# Patient Record
Sex: Male | Born: 1970 | Race: White | Hispanic: No | Marital: Married | Smoking: Never smoker
Health system: Southern US, Community
[De-identification: ages and names within clinical notes are randomized; demographics above are authoritative.]

## PROBLEM LIST (undated history)

## (undated) DIAGNOSIS — K219 Gastro-esophageal reflux disease without esophagitis: Secondary | ICD-10-CM

## (undated) DIAGNOSIS — G8929 Other chronic pain: Secondary | ICD-10-CM

## (undated) DIAGNOSIS — J40 Bronchitis, not specified as acute or chronic: Secondary | ICD-10-CM

## (undated) DIAGNOSIS — M549 Dorsalgia, unspecified: Secondary | ICD-10-CM

## (undated) HISTORY — PX: BACK SURGERY: SHX140

## (undated) HISTORY — PX: DENTAL RESTORATION/EXTRACTION WITH X-RAY: SHX5796

---

## 2012-05-19 ENCOUNTER — Encounter (HOSPITAL_COMMUNITY): Payer: Self-pay | Admitting: Pharmacy Technician

## 2012-05-23 NOTE — Pre-Procedure Instructions (Addendum)
Roy Smith  05/23/2012   Your procedure is scheduled on:   Monday June 01, 2012  Report to [facility] at 530 AM.  Call this number if you have problems the morning of surgery: [phone]   Remember:   Do not eat food or drink liquids after midnight.   Take these medicines the morning of surgery with A SIP OF WATER: albuterol (can bring day of surgery).  ( STOP ibuprofen now)   Do not wear jewelry, make-up or nail polish.  Do not wear lotions, powders, or perfumes.  Do not shave 48 hours prior to surgery. Men may shave face and neck.  Do not bring valuables to the hospital.  Contacts, dentures or bridgework may not be worn into surgery.  Leave suitcase in the car. After surgery it may be brought to your room.     Patients discharged the day of surgery will not be allowed to drive home.  Name and phone number of your driver: family / friend   Special Instructions: Incentive Spirometry - Practice and bring it with you on the day of surgery. Shower using CHG 2 nights before surgery and the night before surgery.  If you shower the day of surgery use CHG.  Use special wash - you have one bottle of CHG for all showers.  You should use approximately 1/3 of the bottle for each shower.   Please read over the following fact sheets that you were given: Pain Booklet, Coughing and Deep Breathing, Blood Transfusion Information, MRSA Information and Surgical Site Infection Prevention

## 2012-05-25 ENCOUNTER — Encounter (HOSPITAL_COMMUNITY)
Admission: RE | Admit: 2012-05-25 | Discharge: 2012-05-25 | Disposition: A | Discharge status: Home / Self Care | Payer: No Typology Code available for payment source | Source: Ambulatory Visit | Attending: Specialist | Admitting: Specialist

## 2012-05-25 ENCOUNTER — Encounter (HOSPITAL_COMMUNITY): Payer: Self-pay

## 2012-05-25 ENCOUNTER — Ambulatory Visit (HOSPITAL_COMMUNITY)
Admission: RE | Admit: 2012-05-25 | Discharge: 2012-05-25 | Disposition: A | Discharge status: Home / Self Care | Payer: No Typology Code available for payment source | Source: Ambulatory Visit | Attending: Physician Assistant | Admitting: Physician Assistant

## 2012-05-25 DIAGNOSIS — Z01812 Encounter for preprocedural laboratory examination: Secondary | ICD-10-CM | POA: Insufficient documentation

## 2012-05-25 DIAGNOSIS — Z01818 Encounter for other preprocedural examination: Secondary | ICD-10-CM | POA: Insufficient documentation

## 2012-05-25 DIAGNOSIS — Z01810 Encounter for preprocedural cardiovascular examination: Secondary | ICD-10-CM | POA: Insufficient documentation

## 2012-05-25 HISTORY — DX: Bronchitis, not specified as acute or chronic: J40

## 2012-05-25 HISTORY — DX: Dorsalgia, unspecified: M54.9

## 2012-05-25 HISTORY — DX: Other chronic pain: G89.29

## 2012-05-25 HISTORY — DX: Gastro-esophageal reflux disease without esophagitis: K21.9

## 2012-05-25 LAB — ABO/RH: ABO/RH(D): A POS

## 2012-05-25 LAB — TYPE AND SCREEN
ABO/RH(D): A POS
Antibody Screen: NEGATIVE

## 2012-05-25 LAB — BASIC METABOLIC PANEL
CO2: 27 mEq/L (ref 19–32)
Calcium: 10 mg/dL (ref 8.4–10.5)
Chloride: 102 mEq/L (ref 96–112)
Glucose, Bld: 98 mg/dL (ref 70–99)
Sodium: 139 mEq/L (ref 135–145)

## 2012-05-25 LAB — URINALYSIS, ROUTINE W REFLEX MICROSCOPIC
Bilirubin Urine: NEGATIVE
Glucose, UA: NEGATIVE mg/dL
Hgb urine dipstick: NEGATIVE
Ketones, ur: NEGATIVE mg/dL
Specific Gravity, Urine: 1.019 (ref 1.005–1.030)
pH: 6 (ref 5.0–8.0)

## 2012-05-25 LAB — CBC
HCT: 44.7 % (ref 39.0–52.0)
MCH: 32.1 pg (ref 26.0–34.0)
MCV: 91.4 fL (ref 78.0–100.0)
Platelets: 178 10*3/uL (ref 150–400)
RBC: 4.89 MIL/uL (ref 4.22–5.81)
WBC: 3.8 10*3/uL — ABNORMAL LOW (ref 4.0–10.5)

## 2012-05-25 LAB — SURGICAL PCR SCREEN: Staphylococcus aureus: POSITIVE — AB

## 2012-05-25 MED ORDER — CHLORHEXIDINE GLUCONATE 4 % EX LIQD: 60.0000 mL | Freq: Once | CUTANEOUS | Status: DC

## 2012-05-29 NOTE — H&P (Signed)
Roy Smith is an 42 y.o. male.   Chief Complaint: back pain  HPI: Pt is S/P decompression and diskectomy L5-S1 in 2004.  He has continued to have progressive low back pain with intermittent left leg pain since his previous surgery. His pain is worsened with repetitive bending,stooping, long episodes of riding in the car or sitting.  He rates the pain in his back as severe or 9 out of 10 on the pain scale.  His left leg pain is primarily lateral thigh and calf and worse with prolonged walking and standing.  He has tried NSAIDS, PT, home exercise program and activity modification.  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  MRI shows collapse of the disk space at L5-S1.  There is a disk protrusion at Left L5-S1 which is broad based and affects the left L5 nerve root.  Also cystic and Modic changes involving the superior endplate of S1.  As pt continues with pain and dysfunction despite conservative measures, surgical intervention is recommended for Left L5-S1 discectomy and fusion with transforaminal lumbar interbody fusion and posterolateral fusion with pedicle screw and rod instrumentation.  Pt wishes to proceed.  Past Medical History  Diagnosis Date  . Bronchitis     hx of  . GERD (gastroesophageal reflux disease)     hx of taking zantac  . Chronic back pain     hx of    Past Surgical History  Procedure Laterality Date  . Back surgery    . Dental restoration/extraction with x-ray      hx of    No family history on file. Social History:  reports that he has never smoked. He does not have any smokeless tobacco history on file. He reports that  drinks alcohol. He reports that he does not use illicit drugs.  Allergies: No Known Allergies  Medications Prior to Admission  Medication Sig Dispense Refill  . albuterol (PROVENTIL HFA;VENTOLIN HFA) 108 (90 BASE) MCG/ACT inhaler Inhale 2 puffs into the lungs every 6 (six) hours as needed. For  shortness of breath      . ibuprofen (ADVIL,MOTRIN) 200 MG tablet Take 400 mg by mouth every 6 (six) hours as needed. For pain        No results found for this or any previous visit (from the past 48 hour(s)). No results found.  Review of Systems  Constitutional: Negative.   HENT: Negative.   Eyes: Negative.   Respiratory: Negative.   Cardiovascular: Negative.   Gastrointestinal: Negative.   Genitourinary: Negative.   Musculoskeletal: Positive for back pain.  Skin: Negative.   Neurological: Negative.   Endo/Heme/Allergies: Negative.   Psychiatric/Behavioral: Negative.     Blood pressure 132/94, pulse 67, temperature 98.1 F (36.7 C), temperature source Oral, resp. rate 20, SpO2 99.00%. Physical Exam  Constitutional: He is oriented to person, place, and time. He appears well-developed and well-nourished.  HENT:  Head: Normocephalic and atraumatic.  Eyes: EOM are normal. Pupils are equal, round, and reactive to light.  Neck: Normal range of motion.  Cardiovascular: Normal rate, regular rhythm, normal heart sounds and intact distal pulses.   Respiratory: Effort normal and breath sounds normal.  GI: Soft.  Musculoskeletal:  Left EHL 5-/5 otherwise LE strength intact.  Limited and painful ROM of lumbar spine  Neurological: He is alert and oriented to person, place, and time. He has normal reflexes.  Skin: Skin is warm and dry.  Psychiatric: He has a normal  mood and affect.     Assessment/Plan L5-S1 left foraminal HNP with degenerative disc disease  PLAN:  Left L5-S1 discectomy, TLIF , and fusion with pedicle screws and rods L5-S1.  Local bone graft.  NITKA,JAMES E 06/01/2012, 7:36 AM

## 2012-05-31 MED ORDER — CEFAZOLIN SODIUM-DEXTROSE 2-3 GM-% IV SOLR
2.0000 g | INTRAVENOUS | Status: AC
  Administered 2012-06-01: 2 g via INTRAVENOUS
  Filled 2012-05-31: qty 50

## 2012-06-01 ENCOUNTER — Encounter (HOSPITAL_COMMUNITY)
Admission: RE | Disposition: A | Discharge status: Home / Self Care | Payer: Self-pay | Source: Ambulatory Visit | Attending: Specialist

## 2012-06-01 ENCOUNTER — Inpatient Hospital Stay (HOSPITAL_COMMUNITY): Payer: No Typology Code available for payment source

## 2012-06-01 ENCOUNTER — Observation Stay (HOSPITAL_COMMUNITY)
Admission: RE | Admit: 2012-06-01 | Discharge: 2012-06-02 | Disposition: A | Discharge status: Home / Self Care | Payer: No Typology Code available for payment source | Source: Ambulatory Visit | Attending: Specialist | Admitting: Specialist

## 2012-06-01 ENCOUNTER — Encounter (HOSPITAL_COMMUNITY): Payer: Self-pay | Admitting: Specialist

## 2012-06-01 ENCOUNTER — Encounter (HOSPITAL_COMMUNITY): Payer: Self-pay | Admitting: Anesthesiology

## 2012-06-01 ENCOUNTER — Inpatient Hospital Stay (HOSPITAL_COMMUNITY): Payer: No Typology Code available for payment source | Admitting: Anesthesiology

## 2012-06-01 DIAGNOSIS — M5137 Other intervertebral disc degeneration, lumbosacral region: Secondary | ICD-10-CM | POA: Insufficient documentation

## 2012-06-01 DIAGNOSIS — M51379 Other intervertebral disc degeneration, lumbosacral region without mention of lumbar back pain or lower extremity pain: Secondary | ICD-10-CM | POA: Insufficient documentation

## 2012-06-01 DIAGNOSIS — M48061 Spinal stenosis, lumbar region without neurogenic claudication: Secondary | ICD-10-CM | POA: Diagnosis present

## 2012-06-01 DIAGNOSIS — M5126 Other intervertebral disc displacement, lumbar region: Principal | ICD-10-CM | POA: Insufficient documentation

## 2012-06-01 HISTORY — PX: DECOMPRESSIVE LUMBAR LAMINECTOMY LEVEL 1: SHX5791

## 2012-06-01 SURGERY — DECOMPRESSIVE LUMBAR LAMINECTOMY LEVEL 1
Anesthesia: General | Site: Spine Lumbar | Laterality: Left | Wound class: Clean

## 2012-06-01 MED ORDER — POTASSIUM CHLORIDE IN NACL 20-0.45 MEQ/L-% IV SOLN
INTRAVENOUS | Status: DC
  Administered 2012-06-01: 21:00:00 via INTRAVENOUS
  Filled 2012-06-01 (×3): qty 1000

## 2012-06-01 MED ORDER — KETOROLAC TROMETHAMINE 30 MG/ML IJ SOLN
30.0000 mg | Freq: Four times a day (QID) | INTRAMUSCULAR | Status: DC
  Administered 2012-06-01 – 2012-06-02 (×4): 30 mg via INTRAVENOUS
  Filled 2012-06-01 (×7): qty 1

## 2012-06-01 MED ORDER — HYDROCODONE-ACETAMINOPHEN 5-325 MG PO TABS
1.0000 | ORAL_TABLET | ORAL | Status: DC | PRN
  Administered 2012-06-01 – 2012-06-02 (×4): 1 via ORAL
  Filled 2012-06-01 (×4): qty 1

## 2012-06-01 MED ORDER — FLEET ENEMA 7-19 GM/118ML RE ENEM: 1.0000 | ENEMA | Freq: Once | RECTAL | Status: AC | PRN

## 2012-06-01 MED ORDER — METHOCARBAMOL 500 MG PO TABS: 500.0000 mg | ORAL_TABLET | Freq: Four times a day (QID) | ORAL | Status: DC | PRN

## 2012-06-01 MED ORDER — BUPIVACAINE-EPINEPHRINE PF 0.5-1:200000 % IJ SOLN
INTRAMUSCULAR | Status: AC
  Filled 2012-06-01: qty 30

## 2012-06-01 MED ORDER — DOCUSATE SODIUM 100 MG PO CAPS
100.0000 mg | ORAL_CAPSULE | Freq: Two times a day (BID) | ORAL | Status: DC
  Administered 2012-06-01 – 2012-06-02 (×2): 100 mg via ORAL
  Filled 2012-06-01 (×3): qty 1

## 2012-06-01 MED ORDER — MORPHINE SULFATE 2 MG/ML IJ SOLN: 1.0000 mg | INTRAMUSCULAR | Status: DC | PRN

## 2012-06-01 MED ORDER — PANTOPRAZOLE SODIUM 40 MG IV SOLR
40.0000 mg | Freq: Every day | INTRAVENOUS | Status: DC
  Administered 2012-06-01: 40 mg via INTRAVENOUS
  Filled 2012-06-01 (×2): qty 40

## 2012-06-01 MED ORDER — ONDANSETRON HCL 4 MG/2ML IJ SOLN: 4.0000 mg | Freq: Once | INTRAMUSCULAR | Status: DC | PRN

## 2012-06-01 MED ORDER — BISACODYL 10 MG RE SUPP: 10.0000 mg | Freq: Every day | RECTAL | Status: DC | PRN

## 2012-06-01 MED ORDER — VECURONIUM BROMIDE 10 MG IV SOLR
INTRAVENOUS | Status: DC | PRN
  Administered 2012-06-01 (×3): 2 mg via INTRAVENOUS

## 2012-06-01 MED ORDER — HYDROMORPHONE HCL PF 1 MG/ML IJ SOLN
0.2500 mg | INTRAMUSCULAR | Status: DC | PRN
  Administered 2012-06-01 (×2): 0.5 mg via INTRAVENOUS

## 2012-06-01 MED ORDER — METHOCARBAMOL 100 MG/ML IJ SOLN
500.0000 mg | Freq: Four times a day (QID) | INTRAVENOUS | Status: DC | PRN
  Filled 2012-06-01: qty 5

## 2012-06-01 MED ORDER — OXYCODONE HCL 5 MG PO TABS: 5.0000 mg | ORAL_TABLET | Freq: Once | ORAL | Status: DC | PRN

## 2012-06-01 MED ORDER — ONDANSETRON HCL 4 MG/2ML IJ SOLN: 4.0000 mg | INTRAMUSCULAR | Status: DC | PRN

## 2012-06-01 MED ORDER — OXYCODONE-ACETAMINOPHEN 5-325 MG PO TABS
1.0000 | ORAL_TABLET | ORAL | Status: DC | PRN
  Administered 2012-06-01: 1 via ORAL
  Filled 2012-06-01: qty 1

## 2012-06-01 MED ORDER — MENTHOL 3 MG MT LOZG
1.0000 | LOZENGE | OROMUCOSAL | Status: DC | PRN
  Filled 2012-06-01: qty 9

## 2012-06-01 MED ORDER — BUPIVACAINE-EPINEPHRINE 0.5% -1:200000 IJ SOLN
INTRAMUSCULAR | Status: DC | PRN
  Administered 2012-06-01: 20 mL

## 2012-06-01 MED ORDER — ACETAMINOPHEN 650 MG RE SUPP: 650.0000 mg | RECTAL | Status: DC | PRN

## 2012-06-01 MED ORDER — SENNOSIDES-DOCUSATE SODIUM 8.6-50 MG PO TABS: 1.0000 | ORAL_TABLET | Freq: Every evening | ORAL | Status: DC | PRN

## 2012-06-01 MED ORDER — MIDAZOLAM HCL 5 MG/5ML IJ SOLN
INTRAMUSCULAR | Status: DC | PRN
  Administered 2012-06-01: 2 mg via INTRAVENOUS

## 2012-06-01 MED ORDER — HYDROMORPHONE HCL PF 1 MG/ML IJ SOLN
INTRAMUSCULAR | Status: AC
  Filled 2012-06-01: qty 1

## 2012-06-01 MED ORDER — NEOSTIGMINE METHYLSULFATE 1 MG/ML IJ SOLN
INTRAMUSCULAR | Status: DC | PRN
  Administered 2012-06-01: 5 mg via INTRAVENOUS

## 2012-06-01 MED ORDER — LIDOCAINE HCL (CARDIAC) 20 MG/ML IV SOLN
INTRAVENOUS | Status: DC | PRN
  Administered 2012-06-01: 100 mg via INTRAVENOUS

## 2012-06-01 MED ORDER — SODIUM CHLORIDE 0.9 % IJ SOLN: 3.0000 mL | Freq: Two times a day (BID) | INTRAMUSCULAR | Status: DC

## 2012-06-01 MED ORDER — FENTANYL CITRATE 0.05 MG/ML IJ SOLN
INTRAMUSCULAR | Status: DC | PRN
  Administered 2012-06-01 (×3): 50 ug via INTRAVENOUS
  Administered 2012-06-01: 100 ug via INTRAVENOUS

## 2012-06-01 MED ORDER — ALBUTEROL SULFATE HFA 108 (90 BASE) MCG/ACT IN AERS
2.0000 | INHALATION_SPRAY | Freq: Four times a day (QID) | RESPIRATORY_TRACT | Status: DC | PRN
  Filled 2012-06-01: qty 6.7

## 2012-06-01 MED ORDER — FLUTICASONE PROPIONATE 50 MCG/ACT NA SUSP
1.0000 | Freq: Two times a day (BID) | NASAL | Status: DC | PRN
  Filled 2012-06-01 (×2): qty 16

## 2012-06-01 MED ORDER — CEFAZOLIN SODIUM 1-5 GM-% IV SOLN
1.0000 g | Freq: Three times a day (TID) | INTRAVENOUS | Status: AC
  Administered 2012-06-01 (×2): 1 g via INTRAVENOUS
  Filled 2012-06-01 (×3): qty 50

## 2012-06-01 MED ORDER — PHENOL 1.4 % MT LIQD: 1.0000 | OROMUCOSAL | Status: DC | PRN

## 2012-06-01 MED ORDER — ARTIFICIAL TEARS OP OINT
TOPICAL_OINTMENT | OPHTHALMIC | Status: DC | PRN
  Administered 2012-06-01: 1 via OPHTHALMIC

## 2012-06-01 MED ORDER — ROCURONIUM BROMIDE 100 MG/10ML IV SOLN
INTRAVENOUS | Status: DC | PRN
  Administered 2012-06-01: 50 mg via INTRAVENOUS

## 2012-06-01 MED ORDER — ONDANSETRON HCL 4 MG/2ML IJ SOLN
INTRAMUSCULAR | Status: DC | PRN
  Administered 2012-06-01: 4 mg via INTRAVENOUS

## 2012-06-01 MED ORDER — PROPOFOL 10 MG/ML IV BOLUS
INTRAVENOUS | Status: DC | PRN
  Administered 2012-06-01: 110 mg via INTRAVENOUS

## 2012-06-01 MED ORDER — MEPERIDINE HCL 25 MG/ML IJ SOLN: 6.2500 mg | INTRAMUSCULAR | Status: DC | PRN

## 2012-06-01 MED ORDER — OXYCODONE-ACETAMINOPHEN 5-325 MG PO TABS: 1.0000 | ORAL_TABLET | ORAL | Status: DC | PRN

## 2012-06-01 MED ORDER — 0.9 % SODIUM CHLORIDE (POUR BTL) OPTIME
TOPICAL | Status: DC | PRN
  Administered 2012-06-01: 1000 mL

## 2012-06-01 MED ORDER — THROMBIN 20000 UNITS EX SOLR
CUTANEOUS | Status: AC
  Filled 2012-06-01: qty 20000

## 2012-06-01 MED ORDER — GLYCOPYRROLATE 0.2 MG/ML IJ SOLN
INTRAMUSCULAR | Status: DC | PRN
  Administered 2012-06-01: .8 mg via INTRAVENOUS

## 2012-06-01 MED ORDER — OXYCODONE HCL 5 MG/5ML PO SOLN: 5.0000 mg | Freq: Once | ORAL | Status: DC | PRN

## 2012-06-01 MED ORDER — LACTATED RINGERS IV SOLN
INTRAVENOUS | Status: DC | PRN
  Administered 2012-06-01 (×2): via INTRAVENOUS

## 2012-06-01 MED ORDER — METHOCARBAMOL 500 MG PO TABS
500.0000 mg | ORAL_TABLET | Freq: Four times a day (QID) | ORAL | Status: DC | PRN
  Administered 2012-06-01 (×2): 500 mg via ORAL
  Filled 2012-06-01 (×2): qty 1

## 2012-06-01 MED ORDER — SURGIFOAM 100 EX MISC
CUTANEOUS | Status: DC | PRN
  Administered 2012-06-01: 09:00:00 via TOPICAL

## 2012-06-01 MED ORDER — SODIUM CHLORIDE 0.9 % IJ SOLN: 3.0000 mL | INTRAMUSCULAR | Status: DC | PRN

## 2012-06-01 MED ORDER — ACETAMINOPHEN 325 MG PO TABS
650.0000 mg | ORAL_TABLET | ORAL | Status: DC | PRN
  Administered 2012-06-02: 650 mg via ORAL
  Filled 2012-06-01: qty 2

## 2012-06-01 MED ORDER — EPHEDRINE SULFATE 50 MG/ML IJ SOLN
INTRAMUSCULAR | Status: DC | PRN
  Administered 2012-06-01: 10 mg via INTRAVENOUS

## 2012-06-01 MED ORDER — SODIUM CHLORIDE 0.9 % IV SOLN: 250.0000 mL | INTRAVENOUS | Status: DC

## 2012-06-01 MED ORDER — NAPROXEN 500 MG PO TABS: 500.0000 mg | ORAL_TABLET | Freq: Two times a day (BID) | ORAL | Status: DC

## 2012-06-01 MED ORDER — ZOLPIDEM TARTRATE 5 MG PO TABS: 5.0000 mg | ORAL_TABLET | Freq: Every evening | ORAL | Status: DC | PRN

## 2012-06-01 SURGICAL SUPPLY — 57 items
BLADE SURG ROTATE 9660 (MISCELLANEOUS) IMPLANT
BUR ROUND FLUTED 4 SOFT TCH (BURR) ×3 IMPLANT
CLOTH BEACON ORANGE TIMEOUT ST (SAFETY) ×3 IMPLANT
CORDS BIPOLAR (ELECTRODE) ×3 IMPLANT
COVER MAYO STAND STRL (DRAPES) ×6 IMPLANT
COVER SURGICAL LIGHT HANDLE (MISCELLANEOUS) ×3 IMPLANT
DERMABOND ADVANCED (GAUZE/BANDAGES/DRESSINGS) ×1
DERMABOND ADVANCED .7 DNX12 (GAUZE/BANDAGES/DRESSINGS) ×2 IMPLANT
DRAPE C-ARM 42X72 X-RAY (DRAPES) ×3 IMPLANT
DRAPE MICROSCOPE LEICA (MISCELLANEOUS) ×3 IMPLANT
DRAPE SURG 17X23 STRL (DRAPES) ×12 IMPLANT
DRAPE TABLE COVER HEAVY DUTY (DRAPES) ×3 IMPLANT
DRSG MEPILEX BORDER 4X4 (GAUZE/BANDAGES/DRESSINGS) ×3 IMPLANT
DRSG MEPILEX BORDER 4X8 (GAUZE/BANDAGES/DRESSINGS) IMPLANT
DURAPREP 26ML APPLICATOR (WOUND CARE) ×3 IMPLANT
ELECT BLADE 6.5 EXT (BLADE) IMPLANT
ELECT CAUTERY BLADE 6.4 (BLADE) ×3 IMPLANT
ELECT REM PT RETURN 9FT ADLT (ELECTROSURGICAL) ×3
ELECTRODE REM PT RTRN 9FT ADLT (ELECTROSURGICAL) ×2 IMPLANT
EVACUATOR 1/8 PVC DRAIN (DRAIN) IMPLANT
GLOVE BIOGEL PI IND STRL 7.5 (GLOVE) ×2 IMPLANT
GLOVE BIOGEL PI INDICATOR 7.5 (GLOVE) ×1
GLOVE ECLIPSE 7.0 STRL STRAW (GLOVE) ×3 IMPLANT
GLOVE ECLIPSE 8.5 STRL (GLOVE) ×3 IMPLANT
GLOVE SURG 8.5 LATEX PF (GLOVE) ×3 IMPLANT
GOWN PREVENTION PLUS LG XLONG (DISPOSABLE) IMPLANT
GOWN PREVENTION PLUS XXLARGE (GOWN DISPOSABLE) ×3 IMPLANT
GOWN STRL NON-REIN LRG LVL3 (GOWN DISPOSABLE) ×6 IMPLANT
HEMOSTAT SURGICEL 2X14 (HEMOSTASIS) IMPLANT
KIT BASIN OR (CUSTOM PROCEDURE TRAY) ×3 IMPLANT
KIT ROOM TURNOVER OR (KITS) ×3 IMPLANT
NEEDLE 22X1 1/2 (OR ONLY) (NEEDLE) ×3 IMPLANT
NEEDLE ASP BONE MRW 11GX15 (NEEDLE) IMPLANT
NEEDLE BONE MARROW 8GAX6 (NEEDLE) IMPLANT
NEEDLE SPNL 18GX3.5 QUINCKE PK (NEEDLE) ×3 IMPLANT
NS IRRIG 1000ML POUR BTL (IV SOLUTION) ×3 IMPLANT
PACK LAMINECTOMY ORTHO (CUSTOM PROCEDURE TRAY) ×3 IMPLANT
PAD ARMBOARD 7.5X6 YLW CONV (MISCELLANEOUS) ×6 IMPLANT
PATTIES SURGICAL .75X.75 (GAUZE/BANDAGES/DRESSINGS) IMPLANT
PATTIES SURGICAL 1X1 (DISPOSABLE) IMPLANT
SLEEVE SURGEON STRL (DRAPES) ×3 IMPLANT
SPONGE GAUZE 4X4 12PLY (GAUZE/BANDAGES/DRESSINGS) ×3 IMPLANT
SUT VIC AB 0 CT1 27 (SUTURE) ×1
SUT VIC AB 0 CT1 27XBRD ANBCTR (SUTURE) ×2 IMPLANT
SUT VIC AB 1 CTX 36 (SUTURE) ×2
SUT VIC AB 1 CTX36XBRD ANBCTR (SUTURE) ×4 IMPLANT
SUT VIC AB 2-0 CT1 27 (SUTURE) ×1
SUT VIC AB 2-0 CT1 TAPERPNT 27 (SUTURE) ×2 IMPLANT
SUT VICRYL 0 CT 1 36IN (SUTURE) ×6 IMPLANT
SUT VICRYL 4-0 PS2 18IN ABS (SUTURE) ×9 IMPLANT
SYR CONTROL 10ML LL (SYRINGE) ×6 IMPLANT
TAPE STRIPS DRAPE STRL (GAUZE/BANDAGES/DRESSINGS) ×3 IMPLANT
TOWEL OR 17X24 6PK STRL BLUE (TOWEL DISPOSABLE) ×3 IMPLANT
TOWEL OR 17X26 10 PK STRL BLUE (TOWEL DISPOSABLE) ×3 IMPLANT
TRAY FOLEY CATH 14FR (SET/KITS/TRAYS/PACK) ×3 IMPLANT
WATER STERILE IRR 1000ML POUR (IV SOLUTION) IMPLANT
YANKAUER SUCT BULB TIP NO VENT (SUCTIONS) ×3 IMPLANT

## 2012-06-01 NOTE — Anesthesia Procedure Notes (Signed)
Procedure Name: Intubation Date/Time: 06/01/2012 7:46 AM Performed by: Marena Chancy Pre-anesthesia Checklist: Patient identified, Emergency Drugs available, Suction available, Patient being monitored and Timeout performed Patient Re-evaluated:Patient Re-evaluated prior to inductionOxygen Delivery Method: Circle system utilized Preoxygenation: Pre-oxygenation with 100% oxygen Intubation Type: IV induction Ventilation: Mask ventilation without difficulty Laryngoscope Size: Miller and 3 Tube type: Oral Tube size: 7.5 mm Number of attempts: 1 Airway Equipment and Method: Stylet Placement Confirmation: ETT inserted through vocal cords under direct vision,  positive ETCO2 and breath sounds checked- equal and bilateral Secured at: 23 cm Tube secured with: Tape Dental Injury: Teeth and Oropharynx as per pre-operative assessment

## 2012-06-01 NOTE — Anesthesia Preprocedure Evaluation (Signed)
Anesthesia Evaluation  Patient identified by MRN, date of birth, ID band Patient awake    Reviewed: Allergy & Precautions, H&P , NPO status , Patient's Chart, lab work & pertinent test results  Airway Mallampati: I TM Distance: >3 FB Neck ROM: Full    Dental   Pulmonary          Cardiovascular     Neuro/Psych    GI/Hepatic GERD-  Controlled,  Endo/Other    Renal/GU      Musculoskeletal   Abdominal   Peds  Hematology   Anesthesia Other Findings   Reproductive/Obstetrics                           Anesthesia Physical Anesthesia Plan  ASA: II  Anesthesia Plan: General   Post-op Pain Management:    Induction: Intravenous  Airway Management Planned: Oral ETT  Additional Equipment:   Intra-op Plan:   Post-operative Plan: Extubation in OR  Informed Consent: I have reviewed the patients History and Physical, chart, labs and discussed the procedure including the risks, benefits and alternatives for the proposed anesthesia with the patient or authorized representative who has indicated his/her understanding and acceptance.     Plan Discussed with: CRNA and Surgeon  Anesthesia Plan Comments:         Anesthesia Quick Evaluation  

## 2012-06-01 NOTE — Brief Op Note (Signed)
06/01/2012  9:31 AM  PATIENT:  Roy Smith  42 y.o. male  PRE-OPERATIVE DIAGNOSIS:  L5-S1 left foraminal HNP with DDD  POST-OPERATIVE DIAGNOSIS:  L5-S1 left foraminal HNP with DDD  PROCEDURE:  Procedure(s) with comments: DECOMPRESSIVE LUMBAR LAMINECTOMY LEVEL 1 (Left) - left lumbar five-sacral one discectomy, OR Microscope.  SURGEON:  Surgeon(s) and Role: Kerrin Champagne, MD - Primary  PHYSICIAN ASSISTANT: Ermalene Postin  ANESTHESIA:   local, general and Dr. Michelle Piper  EBL:  Total I/O In: 1000 [I.V.:1000] Out: 350 [Urine:350]  BLOOD ADMINISTERED:none  DRAINS: none   LOCAL MEDICATIONS USED:  MARCAINE 1/4% with epi. Amount: 20 ml  SPECIMEN:  No Specimen  DISPOSITION OF SPECIMEN:  N/A  COUNTS:  YES  DICTATION: .Dragon Dictation  PLAN OF CARE: Admit for overnight observation  PATIENT DISPOSITION:  PACU - hemodynamically stable.   Delay start of Pharmacological VTE agent (>24hrs) due to surgical blood loss or risk of bleeding: yes

## 2012-06-01 NOTE — H&P (Signed)
Patient was seen and examined in the preop holding area. There has been no interval  Change in this patient's exam preop  history and physical exam  Lab tests and images have been examined and reviewed.  The Risks benefits and alternative treatments have been discussed  extensively,questions answered.  The patient has elected to undergo the discussed surgical treatment. 

## 2012-06-01 NOTE — Anesthesia Postprocedure Evaluation (Signed)
Anesthesia Post Note  Patient: Roy Smith  Procedure(s) Performed: Procedure(s) (LRB): DECOMPRESSIVE LUMBAR LAMINECTOMY LEVEL 1 (Left)  Anesthesia type: general  Patient location: PACU  Post pain: Pain level controlled  Post assessment: Patient's Cardiovascular Status Stable  Last Vitals:  Filed Vitals:   06/01/12 1045  BP: 114/76  Pulse: 55  Temp:   Resp: 17    Post vital signs: Reviewed and stable  Level of consciousness: sedated  Complications: No apparent anesthesia complications

## 2012-06-01 NOTE — Op Note (Signed)
06/01/2012  9:36 AM  PATIENT:  Roy Smith  42 y.o. 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  PROCEDURE:  Procedure(s): DECOMPRESSIVE LUMBAR LAMINECTOMY LEVEL 1, Redo microdiscectomy. OR Microscope.    SURGEON:  Kerrin Champagne, MD     ASSISTANT:  Maud Deed, PA-C  (Present throughout the entire procedure and necessary for completion of procedure in a timely manner)     ANESTHESIA:  General,supplemented with local marcaine 1/4% with epi. 20cc.    COMPLICATIONS:  None.      PROCEDURE:This patient seen in the pre op holding area and discussion undertaken concerning the condition, diagnosis and the expecter procedure. He has a left L5-S1 foramenal disc herniation. If the facet requires resection Then a TLIF fusion would be necessary. The patient understands the planned procedure. The appropriate Left Lumbar level L5-S1 scar identified and marked with "x" and my initials.The patient was then transported to OR and was placed under general anesthesia without difficulty. The patient received appropriate preoperative antibiotic prophylaxis. The patient after intubation, a sterile foley cather was placed atraumatically and the patient was transferred to the operating room table, prone position, with the [facility] OR table. All pressure points were well padded. The arms in 90-90 well-padded at the elbows. Standard prep with duraprep lower dorsal spine to the mid sacral segment. Draped in the usual manner clear Vi-Drape was used. Time-out procedure was called. The old incision scar was marked out aThe skin  was then infiltrated with Marcaine half percent with 1-200,000 epinephrine total of 20 cc used. An incision approximately 3 inches inch and a half in length was then made through skin and subcutaneous layers ellipsing the old incision scar. he lumbodorsal  fascia incised along the left side spinous process L4, L5 and S1.McCollough retractors at the appropriate level  L5-S1. A currette used to debride the L5-S1 interspace on the left side posteriorly and a Penfield #4 placed at the superior aspect of the laminotomy. C-arm lateral confirmed the instrument over the L5-S1 disc space posterior to the spinal canal.The operating room microscope sterilely draped brought into the field. Under the operating room microscope, the  L5-S1 interspace carefully debrided the small amount of muscle attachment here and 1/4 inch osteotome used to osteotomize the medial aspect of the inferior articular process of L5 approximately 20%. A localization lateral C-arm view was obtained with Penfield 4 in the L5-S1 facet. 2 mm Kerrison then used to enter the spinal canal over the superior aspect of the S1 lamina carefully using the Kerrison to debris the attachment as a curet. Foraminotomy was then performed over the left S1 nerve root. The medial 10% superior articular process of S1 then resected using an osteotome and 3 mm Kerrison. This allowed for identification of the thecal sac. Penfield 4 was then used to carefully mobilize the thecal sac medially and the S1 nerve root identified within the lateral recess flattened over the posterior aspect of a partially extruded L5-S1 disc. Carefully the lateral aspect of the S1 nerve root was identified and a Penfield 4 was used to mobilize the nerve medially such that the protruded disc was visible with microscope. Using a Penfield 4 for retraction and a nerve hook was used to free up the disc herniation within the lateral recess on the left side. Disc material was removed using micropituitary rongeurs and nerve hook nerve root  and then more easily able to be mobilized medially and retracted using a Derricho retractor. Further foraminotomies was performed over the L5 nerve root the nerve root was noted to be without further compression. The nerve  root able to be retracted along the medial aspect of the L5 pedicle and disc material found to be subligamentous at this level was further resected current pituitary rongeurs and epstein currettes..  Ligamentum flavum was further debrided superiorly to the L5-S1 level. Had a moderate amount of further resection of the L5 lamina inferiorly and medially was performed. Ball tip nerve probe was then able to carefully palpate the neuroforamen for L5 and S1 finding these to be well decompressed. Bleeding was then controlled using thrombin-soaked Gelfoam small cottonoids. Small amount of bleeding within the soft tissue mass the laminotomy area was controlled using bipolar electrocautery. Irrigation was carried out using copious amounts of irrigant solution. All Gelfoam were then removed. At this point the cause of this patients symptoms were well explained due to L5-S1 lateral recess stenosis with recurrent left L5-S1 HNP and persistent S1 nerve entrapment. The L5 neuroforamen was well decompressed with adequate residual left L5-S1 facet to maintain stability, a fusion was not necessary.  No significant active bleeding present.  All instruments sponge counts were correct traction system was then carefully removed.  Lumbodorsal fascia was then carefully approximated with interrupted 0 Vicryl sutures, UR 6 needle deep subcutaneous layers were approximated with interrupted 0 Vicryl sutures on UR 6 the appear subcutaneous layers approximated with interrupted 2-0 Vicryl sutures and the skin closed with a running subcutaneous stitch of 4-0 Vicryl. Dermabond was applied allowed to dry and then Mepilex bandage applied. Patient was then carefully returned to supine position on a stretcher, reactivated and extubated. He was then returned to recovery room in satisfactory condition.  Maud Deed PA-C perform the duties of assistant surgeon during this case. She was present from the beginning of the case to the end of the case  assisting in transfer the patient from his stretcher to the OR table and back to the stretcher at the end of the case. Assisted in careful retraction and suction of the laminectomy site delicate neural structures operating under the operating room microscope. She performed closure of the incision from the fascia to the skin applying the dressing.   Findings:Left L5-S1 partially extruded herniated nucleous pulposus, with tethered left S1 nerve root due to lateral recess Stenosis. Left L5 foramenal stenosis due to primary degenerative disc osteophyte but L5 nerve was exiting without compression. The left S1 nerve root showed suggestion of chronic compression with white cicatrix changes along the left L5-S1 lateral recess.    Roy Smith  06/01/2012, 9:36 AM

## 2012-06-01 NOTE — Transfer of Care (Signed)
Immediate Anesthesia Transfer of Care Note  Patient: Roy Smith  Procedure(s) Performed: Procedure(s) with comments: DECOMPRESSIVE LUMBAR LAMINECTOMY LEVEL 1 (Left) - left lumbar five-sacral one discectomy  Patient Location: PACU  Anesthesia Type:General  Level of Consciousness: awake, alert  and oriented  Airway & Oxygen Therapy: Patient Spontanous Breathing and Patient connected to nasal cannula oxygen  Post-op Assessment: Report given to PACU RN, Post -op Vital signs reviewed and stable and Patient moving all extremities X 4  Post vital signs: Reviewed and stable  Complications: No apparent anesthesia complications

## 2012-06-01 NOTE — Care Management (Signed)
UR complete  Mackayla Mullins,RN,BSN [phone] 

## 2012-06-01 NOTE — Preoperative (Signed)
Beta Blockers   Reason not to administer Beta Blockers:Not Applicable 

## 2012-06-02 MED ORDER — OXYCODONE-ACETAMINOPHEN 5-325 MG PO TABS: 1.0000 | ORAL_TABLET | ORAL | Status: DC | PRN

## 2012-06-02 MED ORDER — HYDROCODONE-ACETAMINOPHEN 5-325 MG PO TABS: 1.0000 | ORAL_TABLET | Freq: Four times a day (QID) | ORAL | Status: DC | PRN

## 2012-06-02 MED ORDER — METHOCARBAMOL 500 MG PO TABS: 500.0000 mg | ORAL_TABLET | Freq: Three times a day (TID) | ORAL | Status: DC

## 2012-06-02 MED ORDER — PANTOPRAZOLE SODIUM 40 MG PO TBEC: 40.0000 mg | DELAYED_RELEASE_TABLET | Freq: Every day | ORAL | Status: DC

## 2012-06-02 MED ORDER — IBUPROFEN 200 MG PO TABS: 600.0000 mg | ORAL_TABLET | Freq: Four times a day (QID) | ORAL | Status: DC | PRN

## 2012-06-02 NOTE — Progress Notes (Signed)
Tylenol given at 1030 for headache.

## 2012-06-02 NOTE — Discharge Summary (Signed)
Physician Discharge Summary  Patient ID: Roy Smith MRN: [id number] DOB/AGE: Aug 13, 1970 42 y.o.  Admit date: 06/01/2012 Discharge date: 06/02/2012  Admission Diagnoses:  Principal Problem:   Lumbar foraminal stenosis Active Problems:   HNP (herniated nucleus pulposus), lumbar   Discharge Diagnoses:  Same  Past Medical History  Diagnosis Date  . Bronchitis     hx of  . GERD (gastroesophageal reflux disease)     hx of taking zantac  . Chronic back pain     hx of    Surgeries: Procedure(s): DECOMPRESSIVE LUMBAR LAMINECTOMY LEVEL 1 on 06/01/2012   Consultants:    Discharged Condition: Improved  Hospital Course: Roy Smith is an 42 y.o. male who was admitted 06/01/2012 with a chief complaint of No chief complaint on file. , and found to have a diagnosis of Lumbar foraminal stenosis.  They were brought to the operating room on 06/01/2012 and underwent the above named procedures.    They were given perioperative antibiotics:  Anti-infectives   Start     Dose/Rate Route Frequency Ordered Stop   06/01/12 1400  ceFAZolin (ANCEF) IVPB 1 g/50 mL premix     1 g 100 mL/hr over 30 Minutes Intravenous Every 8 hours 06/01/12 1124 06/01/12 2302   06/01/12 0600  ceFAZolin (ANCEF) IVPB 2 g/50 mL premix     2 g 100 mL/hr over 30 Minutes Intravenous On call to O.R. 05/31/12 1320 06/01/12 0800    . Prior to surgery we had discussion regarding planned procedure including exploration discs prior to consideration of T. lift procedure. Patient is fully aware of this then underwent the aforementioned procedure. At the time of exploration the disc it was found to have lateral recess stenosis affecting the left S1 nerve root and tethering it with a partially extruded herniated nucleus pulposus left side L5-S1. The findings intraoperatively appeared to explain the patient's pain pattern so that intraoperatively decision was made to forego transforaminal lumbar interbody fusion. Left L5  nerve root was well decompressed. Postoperatively the patient noted immediate relief of pain with left lower extremity and some improvement numbness been experiencing for years. Ambulated the evening of the surgery and the following morning. Voiding urine without difficulty. Taking tolerating by mouth narcotic medicines and oral diet. Patient was discharged home on postoperative day #1. They were given sequential compression devices, early ambulation, and chemoprophylaxis for DVT prophylaxis.  They benefited maximally from their hospital stay and there were no complications.    Recent vital signs:  Filed Vitals:   06/02/12 1145  BP: 96/68  Pulse: 70  Temp: 98.3 F (36.8 C)  Resp: 18    Recent laboratory studies:  Results for orders placed during the hospital encounter of 05/25/12  SURGICAL PCR SCREEN      Result Value Range   MRSA, PCR NEGATIVE  NEGATIVE   Staphylococcus aureus POSITIVE (*) NEGATIVE  BASIC METABOLIC PANEL      Result Value Range   Sodium 139  135 - 145 mEq/L   Potassium 4.7  3.5 - 5.1 mEq/L   Chloride 102  96 - 112 mEq/L   CO2 27  19 - 32 mEq/L   Glucose, Bld 98  70 - 99 mg/dL   BUN 15  6 - 23 mg/dL   Creatinine, Ser 4.09  0.50 - 1.35 mg/dL   Calcium 81.1  8.4 - 91.4 mg/dL   GFR calc non Af Amer >90  >90 mL/min   GFR calc Af Amer >90  >90 mL/min  CBC      Result Value Range   WBC 3.8 (*) 4.0 - 10.5 K/uL   RBC 4.89  4.22 - 5.81 MIL/uL   Hemoglobin 15.7  13.0 - 17.0 g/dL   HCT 16.1  09.6 - 04.5 %   MCV 91.4  78.0 - 100.0 fL   MCH 32.1  26.0 - 34.0 pg   MCHC 35.1  30.0 - 36.0 g/dL   RDW 40.9  81.1 - 91.4 %   Platelets 178  150 - 400 K/uL  URINALYSIS, ROUTINE W REFLEX MICROSCOPIC      Result Value Range   Color, Urine YELLOW  YELLOW   APPearance CLEAR  CLEAR   Specific Gravity, Urine 1.019  1.005 - 1.030   pH 6.0  5.0 - 8.0   Glucose, UA NEGATIVE  NEGATIVE mg/dL   Hgb urine dipstick NEGATIVE  NEGATIVE   Bilirubin Urine NEGATIVE  NEGATIVE   Ketones, ur  NEGATIVE  NEGATIVE mg/dL   Protein, ur NEGATIVE  NEGATIVE mg/dL   Urobilinogen, UA 0.2  0.0 - 1.0 mg/dL   Nitrite NEGATIVE  NEGATIVE   Leukocytes, UA NEGATIVE  NEGATIVE  TYPE AND SCREEN      Result Value Range   ABO/RH(D) A POS     Antibody Screen NEG     Sample Expiration 06/08/2012    ABO/RH      Result Value Range   ABO/RH(D) A POS      Discharge Medications:     Medication List    TAKE these medications       albuterol 108 (90 BASE) MCG/ACT inhaler  Commonly known as:  PROVENTIL HFA;VENTOLIN HFA  Inhale 2 puffs into the lungs every 6 (six) hours as needed. For shortness of breath     HYDROcodone-acetaminophen 5-325 MG per tablet  Commonly known as:  NORCO/VICODIN  Take 1-2 tablets by mouth every 6 (six) hours as needed for pain.     ibuprofen 200 MG tablet  Commonly known as:  MOTRIN IB  Take 3 tablets (600 mg total) by mouth every 6 (six) hours as needed for pain.     methocarbamol 500 MG tablet  Commonly known as:  ROBAXIN  Take 1 tablet (500 mg total) by mouth every 6 (six) hours as needed.     methocarbamol 500 MG tablet  Commonly known as:  ROBAXIN  Take 1 tablet (500 mg total) by mouth 3 (three) times daily.     naproxen 500 MG tablet  Commonly known as:  NAPROSYN  Take 1 tablet (500 mg total) by mouth 2 (two) times daily with a meal.     oxyCODONE-acetaminophen 5-325 MG per tablet  Commonly known as:  ROXICET  Take 1-2 tablets by mouth every 4 (four) hours as needed for pain.     oxyCODONE-acetaminophen 5-325 MG per tablet  Commonly known as:  PERCOCET/ROXICET  Take 1-2 tablets by mouth every 4 (four) hours as needed for pain (do not mix with hydrocodone.).        Diagnostic Studies: Dg Chest 2 View  05/25/2012  *RADIOLOGY REPORT*  Clinical Data: Preop for lumbar surgery  CHEST - 2 VIEW  Comparison: None.  Findings: Cardiomediastinal silhouette is unremarkable.  No acute infiltrate or pleural effusion.  No pulmonary edema.  Mild degenerative changes  thoracic spine.  IMPRESSION: No active disease.   Original Report Authenticated By: Natasha Mead, M.D.    Dg Lumbar Spine 1 View  06/01/2012  *RADIOLOGY REPORT*  Clinical Data: Disc  herniation L4-L5.  DG C-ARM 1-60 MIN,LUMBAR SPINE - 1 VIEW  Comparison:  Lumbar MRI 04/16/2012.  Findings: A single spot fluoroscopic image of the lower lumbar spine in the lateral projection is submitted.  This demonstrates skin spreaders posterior to the lumbosacral junction.  A blunt surgical instrument overlies the posterior aspect of the L5-S1 disc space.  IMPRESSION: Intraoperative L5-S1 disc space localization.  This appears to be the level of the most significant pathology on the prior MRI, but does not correspond with the provided history - correlate clinically. (Report in EPIC does indicate surgery to have been performed at L5-S1 for a left foraminal disc herniation.)   Original Report Authenticated By: Carey Bullocks, M.D.    Dg C-arm 1-60 Min  06/01/2012  *RADIOLOGY REPORT*  Clinical Data: Disc herniation L4-L5.  DG C-ARM 1-60 MIN,LUMBAR SPINE - 1 VIEW  Comparison:  Lumbar MRI 04/16/2012.  Findings: A single spot fluoroscopic image of the lower lumbar spine in the lateral projection is submitted.  This demonstrates skin spreaders posterior to the lumbosacral junction.  A blunt surgical instrument overlies the posterior aspect of the L5-S1 disc space.  IMPRESSION: Intraoperative L5-S1 disc space localization.  This appears to be the level of the most significant pathology on the prior MRI, but does not correspond with the provided history - correlate clinically. (Report in EPIC does indicate surgery to have been performed at L5-S1 for a left foraminal disc herniation.)   Original Report Authenticated By: Carey Bullocks, M.D.     Disposition: 01-Home or Self Care      Discharge Orders   Future Orders Complete By Expires     Call MD / Call 911  As directed     Comments:      If you experience chest pain or  shortness of breath, CALL 911 and be transported to the hospital emergency room.  If you develope a fever above 101 F, pus (white drainage) or increased drainage or redness at the wound, or calf pain, call your surgeon's office.    Constipation Prevention  As directed     Comments:      Drink plenty of fluids.  Prune juice may be helpful.  You may use a stool softener, such as Colace (over the counter) 100 mg twice a day.  Use MiraLax (over the counter) for constipation as needed.    Diet - low sodium heart healthy  As directed     Discharge instructions  As directed     Comments:      No lifting greater than 10 lbs. Avoid bending, stooping and twisting. Walk in house for first week them may start to get out slowly increasing distance up to one mile by 3 weeks post op. Keep incision dry for 3 days, may use tegaderm or similar water impervious dressing.    Driving restrictions  As directed     Comments:      No driving for 2 weeks    Increase activity slowly as tolerated  As directed     Lifting restrictions  As directed     Comments:      No lifting for 6 weeks       Follow-up Information   Follow up with Autie Vasudevan E, MD In 2 weeks.   Contact information:   [address] [place] [address] [phone]        Signed: Kerrin Champagne 06/02/2012, 3:44 PM

## 2012-06-02 NOTE — Progress Notes (Signed)
Pt discharged to home accompanied by wife. Discharged instructions and rx given and explained. Pt and family stated understanding. Pts IV was removed. Pt left unit in a stable condition via wheelchair.

## 2012-06-02 NOTE — Progress Notes (Signed)
Subjective: 1 Day Post-Op Procedure(s) (LRB): DECOMPRESSIVE LUMBAR LAMINECTOMY LEVEL 1 (Left) Awake alert Ox4. I want to get out of here. Patient reports pain as mild.    Objective:   VITALS:  Temp:  [97.1 F (36.2 C)-97.9 F (36.6 C)] 97.9 F (36.6 C) (02/18 0629) Pulse Rate:  [57-67] 57 (02/18 0629) Resp:  [14-20] 18 (02/18 0629) BP: (104-126)/(75-95) 104/75 mmHg (02/18 0629) SpO2:  [99 %-100 %] 99 % (02/18 0629)  Neurologically intact ABD soft Neurovascular intact Sensation intact distally Intact pulses distally Dorsiflexion/Plantar flexion intact Incision: dressing C/D/I and no drainage   LABS No results found for this basename: HGB, WBC, PLT,  in the last 72 hours No results found for this basename: NA, K, CL, CO2, BUN, CREATININE, GLUCOSE,  in the last 72 hours No results found for this basename: LABPT, INR,  in the last 72 hours   Assessment/Plan: 1 Day Post-Op Procedure(s) (LRB): DECOMPRESSIVE LUMBAR LAMINECTOMY LEVEL 1 (Left)  Advance diet D/C IV fluids Discharge home with home health  NITKA,JAMES E 06/02/2012, 10:57 AM

## 2012-06-03 ENCOUNTER — Encounter (HOSPITAL_COMMUNITY): Payer: Self-pay | Admitting: Specialist

## 2012-06-04 MED FILL — Heparin Sodium (Porcine) Inj 1000 Unit/ML: INTRAMUSCULAR | Qty: 30 | Status: AC

## 2012-06-04 MED FILL — Sodium Chloride IV Soln 0.9%: INTRAVENOUS | Qty: 1000 | Status: AC

## 2013-12-11 IMAGING — RF DG C-ARM 61-120 MIN
1 series · 1 of 1 positions shown · non-contrast
Comparison: Lumbar MRI 04/16/2012.

CLINICAL DATA: Disc herniation L4-L5.

DG C-ARM 1-60 MIN,LUMBAR SPINE - 1 VIEW

[Series 1: run · 1 of 1 slices shown]
[im 1/1]
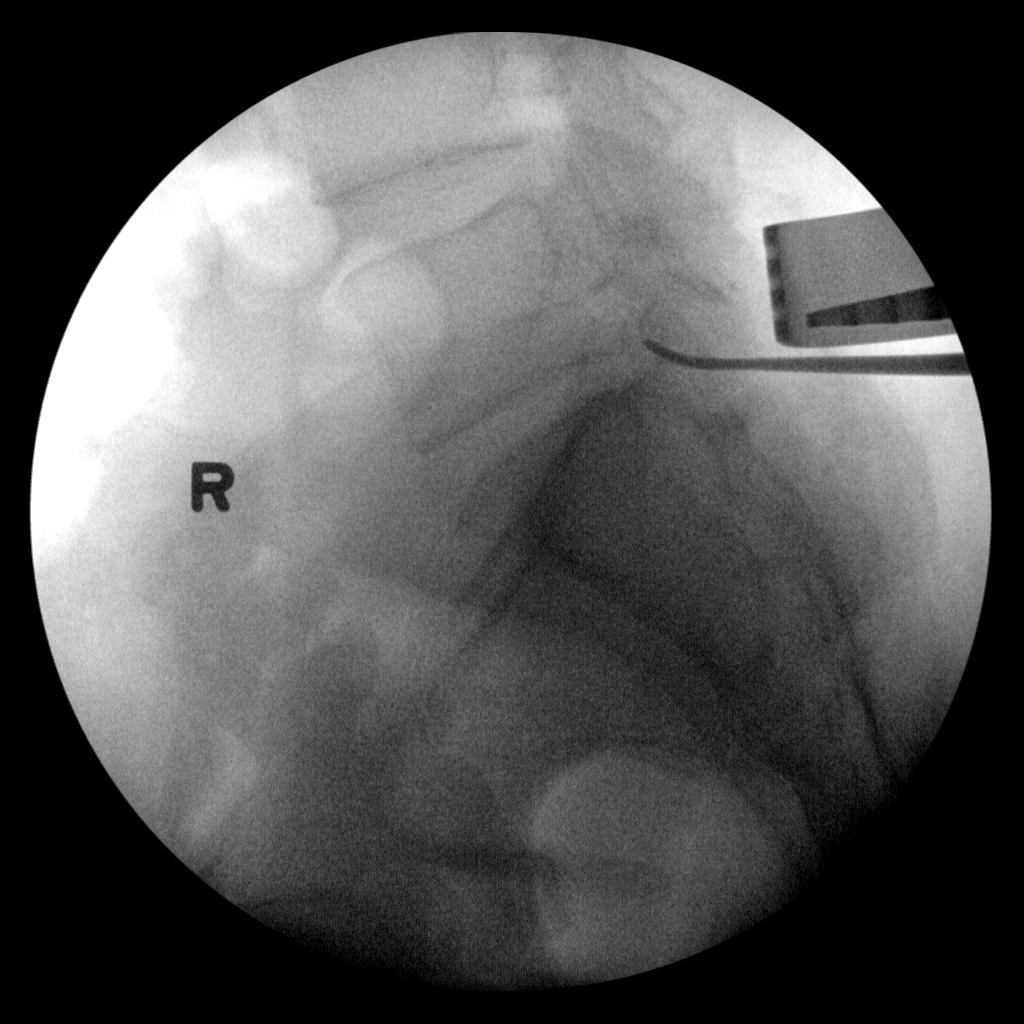

[1 of 1 positions shown; findings below may reference images not displayed]

FINDINGS: A single spot fluoroscopic image of the lower lumbar
spine in the lateral projection is submitted.  This demonstrates
skin spreaders posterior to the lumbosacral junction.  A blunt
surgical instrument overlies the posterior aspect of the L5-S1 disc
space.
IMPRESSION: Intraoperative L5-S1 disc space localization.  This appears to be
the level of the most significant pathology on the prior MRI, but
does not correspond with the provided history - correlate
clinically. (Report in [REDACTED] does indicate surgery to have been
performed at L5-S1 for a left foraminal disc herniation.)

## 2016-09-18 ENCOUNTER — Ambulatory Visit: Payer: Self-pay | Admitting: Physician Assistant

## 2016-09-18 ENCOUNTER — Encounter: Payer: Self-pay | Admitting: Physician Assistant

## 2016-09-18 VITALS — BP 110/70 | HR 80 | Temp 98.5°F | Ht 69.0 in | Wt 216.0 lb

## 2016-09-18 DIAGNOSIS — Z008 Encounter for other general examination: Secondary | ICD-10-CM

## 2016-09-18 DIAGNOSIS — Z0189 Encounter for other specified special examinations: Secondary | ICD-10-CM

## 2016-09-18 DIAGNOSIS — Z0000 Encounter for general adult medical examination without abnormal findings: Secondary | ICD-10-CM

## 2016-09-18 NOTE — Progress Notes (Signed)
S: pt here for wellness physical and biometrics for insurance purposes, no complaints ros neg. PMH:  neg  Social:  Nonsmoker, +dip, etoh on weekends when he's off maybe 6-8 beers if they cook out; no drugs Fam: mother has dm controlled by diet, no hx cancer, cad, stroke  O: vitals wnl, nad, ENT wnl, neck supple no lymph, lungs c t a, cv rrr, abd soft nontender bs normal all 4 quads, skin with large brown spot on r cheek typical of sun damage  A: wellness, biometric physical  P: labs drawn today, f/uwith dermatology for place on cheek

## 2016-09-20 LAB — CMP12+LP+TP+TSH+6AC+PSA+CBC…
A/G RATIO: 1.5 (ref 1.2–2.2)
ALT: 21 IU/L (ref 0–44)
AST: 23 IU/L (ref 0–40)
Albumin: 4.5 g/dL (ref 3.5–5.5)
Alkaline Phosphatase: 59 IU/L (ref 39–117)
BASOS ABS: 0 10*3/uL (ref 0.0–0.2)
BILIRUBIN TOTAL: 0.3 mg/dL (ref 0.0–1.2)
BUN/Creatinine Ratio: 10 (ref 9–20)
BUN: 9 mg/dL (ref 6–24)
Basos: 1 %
CREATININE: 0.89 mg/dL (ref 0.76–1.27)
Calcium: 9.3 mg/dL (ref 8.7–10.2)
Chloride: 105 mmol/L (ref 96–106)
Chol/HDL Ratio: 3.3 ratio (ref 0.0–5.0)
Cholesterol, Total: 221 mg/dL — ABNORMAL HIGH (ref 100–199)
EOS (ABSOLUTE): 0 10*3/uL (ref 0.0–0.4)
Eos: 1 %
Estimated CHD Risk: 0.5 times avg. (ref 0.0–1.0)
Free Thyroxine Index: 2.1 (ref 1.2–4.9)
GFR calc non Af Amer: 103 mL/min/{1.73_m2} (ref >59)
GFR, EST AFRICAN AMERICAN: 119 mL/min/{1.73_m2} (ref >59)
GGT: 27 IU/L (ref 0–65)
GLUCOSE: 104 mg/dL — AB (ref 65–99)
Globulin, Total: 3.1 g/dL (ref 1.5–4.5)
HDL: 66 mg/dL (ref >39)
Hematocrit: 47 % (ref 37.5–51.0)
Hemoglobin: 15.4 g/dL (ref 13.0–17.7)
IMMATURE GRANULOCYTES: 0 %
IRON: 84 ug/dL (ref 38–169)
Immature Grans (Abs): 0 10*3/uL (ref 0.0–0.1)
LDH: 148 IU/L (ref 121–224)
LDL Calculated: 130 mg/dL — ABNORMAL HIGH (ref 0–99)
LYMPHS: 48 %
Lymphocytes Absolute: 1.6 10*3/uL (ref 0.7–3.1)
MCH: 31.8 pg (ref 26.6–33.0)
MCHC: 32.8 g/dL (ref 31.5–35.7)
MCV: 97 fL (ref 79–97)
MONOCYTES: 7 %
Monocytes Absolute: 0.3 10*3/uL (ref 0.1–0.9)
NEUTROS PCT: 43 %
Neutrophils Absolute: 1.5 10*3/uL (ref 1.4–7.0)
PROSTATE SPECIFIC AG, SERUM: 0.6 ng/mL (ref 0.0–4.0)
Phosphorus: 3.4 mg/dL (ref 2.5–4.5)
Platelets: 213 10*3/uL (ref 150–379)
Potassium: 4.5 mmol/L (ref 3.5–5.2)
RBC: 4.85 x10E6/uL (ref 4.14–5.80)
RDW: 13 % (ref 12.3–15.4)
Sodium: 142 mmol/L (ref 134–144)
T3 UPTAKE RATIO: 33 % (ref 24–39)
T4, Total: 6.3 ug/dL (ref 4.5–12.0)
TOTAL PROTEIN: 7.6 g/dL (ref 6.0–8.5)
TSH: 2.77 u[IU]/mL (ref 0.450–4.500)
Triglycerides: 123 mg/dL (ref 0–149)
URIC ACID: 5.3 mg/dL (ref 3.7–8.6)
VLDL CHOLESTEROL CAL: 25 mg/dL (ref 5–40)
WBC: 3.4 10*3/uL (ref 3.4–10.8)

## 2016-09-20 LAB — VITAMIN D 25 HYDROXY (VIT D DEFICIENCY, FRACTURES): Vit D, 25-Hydroxy: 24.9 ng/mL — ABNORMAL LOW (ref 30.0–100.0)

## 2016-09-20 LAB — HIV ANTIBODY (ROUTINE TESTING W REFLEX): HIV SCREEN 4TH GENERATION: NONREACTIVE

## 2016-09-20 LAB — HEPATITIS C ANTIBODY (REFLEX): HCV Ab: <0.1 s/co ratio (ref 0.0–0.9)

## 2016-09-20 LAB — HCV COMMENT:

## 2017-05-15 ENCOUNTER — Ambulatory Visit: Payer: Self-pay | Admitting: Medical

## 2017-05-15 VITALS — BP 120/90 | HR 89 | Temp 98.0°F | Ht 68.0 in | Wt 228.0 lb

## 2017-05-15 DIAGNOSIS — Z0189 Encounter for other specified special examinations: Secondary | ICD-10-CM

## 2017-05-15 DIAGNOSIS — Z299 Encounter for prophylactic measures, unspecified: Secondary | ICD-10-CM

## 2017-05-15 DIAGNOSIS — Z008 Encounter for other general examination: Secondary | ICD-10-CM

## 2017-05-15 DIAGNOSIS — Z0000 Encounter for general adult medical examination without abnormal findings: Secondary | ICD-10-CM

## 2017-05-15 NOTE — Progress Notes (Addendum)
   Subjective:    Patient ID: Roy Smith, male    DOB: 06-Mar-1971, 47 y.o.   MRN: [id number]  H[facility] 47 yo [facility]. No complaints today. Would  also like HIV added to his blood work due to his job. Denies any known exposure.   Review of Systems  Constitutional: Negative for chills and fever.  HENT: Negative for congestion, ear pain and sore throat.   Respiratory: Negative for cough and shortness of breath.   Cardiovascular: Negative for chest pain.  Gastrointestinal: Negative for abdominal pain.  Genitourinary: Negative for dysuria.  Skin: Negative for rash.  Hematological: Negative for adenopathy.  Psychiatric/Behavioral: Negative for behavioral problems, self-injury and suicidal ideas. The patient is not nervous/anxious.        Objective:   Physical Exam  Constitutional: He is oriented to person, place, and time. He appears well-developed and well-nourished.  HENT:  Head: Normocephalic and atraumatic.  Right Ear: Hearing, tympanic membrane and external ear normal.  Left Ear: Hearing, tympanic membrane and external ear normal.  Nose: Nose normal.  Mouth/Throat: Uvula is midline, oropharynx is clear and moist and mucous membranes are normal.  Eyes: Conjunctivae, EOM and lids are normal. Pupils are equal, round, and reactive to light.  Fundoscopic exam:      The right eye shows red reflex.       The left eye shows red reflex.  Neck: Trachea normal and normal range of motion. Neck supple.  Cardiovascular: Normal rate, regular rhythm and normal heart sounds. Exam reveals no gallop.  No murmur heard. Pulmonary/Chest: Effort normal and breath sounds normal.  Musculoskeletal: Normal range of motion.  Neurological: He is alert and oriented to person, place, and time. He has normal strength. No cranial nerve deficit. GCS eye subscore is 4. GCS verbal subscore is 5. GCS motor subscore is 6.  Reflex Scores:       Patellar reflexes are 2+ on the right side and 2+ on the left side. Skin: Skin is warm, dry and intact.  Psychiatric: He has a normal mood and affect. His speech is normal and behavior is normal. Judgment and thought content normal. Cognition and memory are normal.  Nursing note and vitals reviewed.  Equal grips 5/5 upper and lower extremity strength  Canal with flaky ear wax bilaterally, denies itching. Full range of motion, easily gets on and off the exam table, gait within normal limits.     Assessment & Plan:  well exam Biometric screening Patient would like HIV lab added to his panel. Pending labs Return to the clinic as needed.  Patient verbalizes understanding and has no questions at discharge.

## 2017-05-16 LAB — CMP12+LP+TP+TSH+6AC+PSA+CBC…
Basophils Absolute: 0 10*3/uL (ref 0.0–0.2)
Basos: 1 %
EOS (ABSOLUTE): 0.1 10*3/uL (ref 0.0–0.4)
EOS: 3 %
Hematocrit: 43.8 % (ref 37.5–51.0)
Hemoglobin: 15.5 g/dL (ref 13.0–17.7)
IMMATURE GRANULOCYTES: 0 %
Immature Grans (Abs): 0 10*3/uL (ref 0.0–0.1)
LYMPHS ABS: 1.6 10*3/uL (ref 0.7–3.1)
Lymphs: 40 %
MCH: 32.4 pg (ref 26.6–33.0)
MCHC: 35.4 g/dL (ref 31.5–35.7)
MCV: 91 fL (ref 79–97)
MONOCYTES: 8 %
MONOS ABS: 0.3 10*3/uL (ref 0.1–0.9)
NEUTROS PCT: 48 %
Neutrophils Absolute: 2 10*3/uL (ref 1.4–7.0)
Platelets: 201 10*3/uL (ref 150–379)
RBC: 4.79 x10E6/uL (ref 4.14–5.80)
RDW: 13.2 % (ref 12.3–15.4)
WBC: 4.1 10*3/uL (ref 3.4–10.8)

## 2017-05-16 LAB — VITAMIN B12

## 2017-05-16 LAB — HIV ANTIBODY (ROUTINE TESTING W REFLEX): HIV Screen 4th Generation wRfx: NONREACTIVE

## 2017-05-17 LAB — SPECIMEN STATUS REPORT

## 2017-05-17 LAB — VITAMIN B12: Vitamin B-12: 705 pg/mL (ref 232–1245)

## 2017-05-17 LAB — THYROID PANEL WITH TSH
Free Thyroxine Index: 1.7 (ref 1.2–4.9)
T3 UPTAKE RATIO: 29 % (ref 24–39)
T4 TOTAL: 5.9 ug/dL (ref 4.5–12.0)
TSH: 1.96 u[IU]/mL (ref 0.450–4.500)

## 2017-05-17 LAB — CMP12+LP+6AC
A/G RATIO: 1.6 (ref 1.2–2.2)
ALK PHOS: 67 IU/L (ref 39–117)
ALT: 31 IU/L (ref 0–44)
AST: 25 IU/L (ref 0–40)
Albumin: 4.6 g/dL (ref 3.5–5.5)
BUN / CREAT RATIO: 11 (ref 9–20)
BUN: 10 mg/dL (ref 6–24)
Bilirubin Total: <0.2 mg/dL (ref 0.0–1.2)
CHOL/HDL RATIO: 3.8 ratio (ref 0.0–5.0)
Calcium: 9.5 mg/dL (ref 8.7–10.2)
Chloride: 104 mmol/L (ref 96–106)
Cholesterol, Total: 241 mg/dL — ABNORMAL HIGH (ref 100–199)
Creatinine, Ser: 0.9 mg/dL (ref 0.76–1.27)
Estimated CHD Risk: 0.7 times avg. (ref 0.0–1.0)
GFR calc Af Amer: 119 mL/min/{1.73_m2} (ref >59)
GFR calc non Af Amer: 103 mL/min/{1.73_m2} (ref >59)
GGT: 35 IU/L (ref 0–65)
GLOBULIN, TOTAL: 2.8 g/dL (ref 1.5–4.5)
GLUCOSE: 151 mg/dL — AB (ref 65–99)
HDL: 63 mg/dL (ref >39)
Iron: 62 ug/dL (ref 38–169)
LDH: 158 IU/L (ref 121–224)
LDL CALC: 149 mg/dL — AB (ref 0–99)
PHOSPHORUS: 3.9 mg/dL (ref 2.5–4.5)
POTASSIUM: 4 mmol/L (ref 3.5–5.2)
SODIUM: 143 mmol/L (ref 134–144)
TRIGLYCERIDES: 147 mg/dL (ref 0–149)
Total Protein: 7.4 g/dL (ref 6.0–8.5)
URIC ACID: 6.4 mg/dL (ref 3.7–8.6)
VLDL CHOLESTEROL CAL: 29 mg/dL (ref 5–40)

## 2017-05-17 LAB — PSA: Prostate Specific Ag, Serum: 0.7 ng/mL (ref 0.0–4.0)

## 2017-05-20 NOTE — Progress Notes (Signed)
Please add on A1C t due to elevated glucose  labs.  Please call patient cholesterol [phone]. Increased since visit  8 months ago. Diet exercise x 6 months then a recheck of cholesterol. TY

## 2018-07-27 ENCOUNTER — Other Ambulatory Visit: Payer: Self-pay

## 2018-07-27 ENCOUNTER — Ambulatory Visit: Payer: Managed Care, Other (non HMO) | Admitting: Adult Health

## 2018-07-27 VITALS — BP 120/86 | HR 86 | Temp 98.5°F | Resp 14 | Ht 68.0 in | Wt 213.0 lb

## 2018-07-27 DIAGNOSIS — Z0189 Encounter for other specified special examinations: Secondary | ICD-10-CM

## 2018-07-27 DIAGNOSIS — Z008 Encounter for other general examination: Secondary | ICD-10-CM

## 2018-07-27 NOTE — Patient Instructions (Signed)
I will have the office call you on your glucose and cholesterol results when they return if you have not heard within 1 week please call the office.  This biometric physical is a brief physical and the only labs done are glucose and your lipid panel(cholesterol) and is  not a substitute for seeing a primary care provider for a complete annual physical. Please see a primary care physician for routine health maintenance, labs and full physical at least yearly and follow up as recommended by your provider. Provider also recommends if you do not have a primary care provider for patient to establish care as soon as possible .Patient may chose provider of choice. Also gave the [facility]  PHYSICIAN/PROVIDER  REFERRAL LINE at [phone] or web site at [email or web address] to help assist with finding a primary care doctor.  Patient verbalizes understanding that his office is acute care only and not a substitute for a primary care or for the management of chronic conditions.    Follow up with primary care as needed for chronic and maintenance health care- can be seen in this employee clinic for acute care.    Health Maintenance, Male A healthy lifestyle and preventive care is important for your health and wellness. Ask your health care provider about what schedule of regular examinations is right for you. What should I know about weight and diet? Eat a Healthy Diet  Eat plenty of vegetables, fruits, whole grains, low-fat dairy products, and lean protein.  Do not eat a lot of foods high in solid fats, added sugars, or salt.  Maintain a Healthy Weight Regular exercise can help you achieve or maintain a healthy weight. You should:  Do at least 150 minutes of exercise each week. The exercise should increase your heart rate and make you sweat (moderate-intensity exercise).  Do strength-training exercises at least twice a week. Watch Your Levels of Cholesterol and Blood Lipids  Have your blood tested  for lipids and cholesterol every 5 years starting at 47 years of age. If you are at high risk for heart disease, you should start having your blood tested when you are 47 years old. You may need to have your cholesterol levels checked more often if: ? Your lipid or cholesterol levels are high. ? You are older than 47 years of age. ? You are at high risk for heart disease. What should I know about cancer screening? Many types of cancers can be detected early and may often be prevented. Lung Cancer  You should be screened every year for lung cancer if: ? You are a current smoker who has smoked for at least 30 years. ? You are a former smoker who has quit within the past 15 years.  Talk to your health care provider about your screening options, when you should start screening, and how often you should be screened. Colorectal Cancer  Routine colorectal cancer screening usually begins at 47 years of age and should be repeated every 5-10 years until you are 47 years old. You may need to be screened more often if early forms of precancerous polyps or small growths are found. Your health care provider may recommend screening at an earlier age if you have risk factors for colon cancer.  Your health care provider may recommend using home test kits to check for hidden blood in the stool.  A small camera at the end of a tube can be used to examine your colon (sigmoidoscopy or colonoscopy). This checks for   the earliest forms of colorectal cancer. Prostate and Testicular Cancer  Depending on your age and overall health, your health care provider may do certain tests to screen for prostate and testicular cancer.  Talk to your health care provider about any symptoms or concerns you have about testicular or prostate cancer. Skin Cancer  Check your skin from head to toe regularly.  Tell your health care provider about any new moles or changes in moles, especially if: ? There is a change in a mole's size,  shape, or color. ? You have a mole that is larger than a pencil eraser.  Always use sunscreen. Apply sunscreen liberally and repeat throughout the day.  Protect yourself by wearing long sleeves, pants, a wide-brimmed hat, and sunglasses when outside. What should I know about heart disease, diabetes, and high blood pressure?  If you are 17-57 years of age, have your blood pressure checked every 3-5 years. If you are 25 years of age or older, have your blood pressure checked every year. You should have your blood pressure measured twice-once when you are at a hospital or clinic, and once when you are not at a hospital or clinic. Record the average of the two measurements. To check your blood pressure when you are not at a hospital or clinic, you can use: ? An automated blood pressure machine at a pharmacy. ? A home blood pressure monitor.  Talk to your health care provider about your target blood pressure.  If you are between 55-35 years old, ask your health care provider if you should take aspirin to prevent heart disease.  Have regular diabetes screenings by checking your fasting blood sugar level. ? If you are at a normal weight and have a low risk for diabetes, have this test once every three years after the age of 90. ? If you are overweight and have a high risk for diabetes, consider being tested at a younger age or more often.  A one-time screening for abdominal aortic aneurysm (AAA) by ultrasound is recommended for men aged 67-75 years who are current or former smokers. What should I know about preventing infection? Hepatitis B If you have a higher risk for hepatitis B, you should be screened for this virus. Talk with your health care provider to find out if you are at risk for hepatitis B infection. Hepatitis C Blood testing is recommended for:  Everyone born from [birth year] through 1964.  Anyone with known risk factors for hepatitis C. Sexually Transmitted Diseases (STDs)  You  should be screened each year for STDs including gonorrhea and chlamydia if: ? You are sexually active and are younger than 47 years of age. ? You are older than 47 years of age and your health care provider tells you that you are at risk for this type of infection. ? Your sexual activity has changed since you were last screened and you are at an increased risk for chlamydia or gonorrhea. Ask your health care provider if you are at risk.  Talk with your health care provider about whether you are at high risk of being infected with HIV. Your health care provider may recommend a prescription medicine to help prevent HIV infection. What else can I do?  Schedule regular health, dental, and eye exams.  Stay current with your vaccines (immunizations).  Do not use any tobacco products, such as cigarettes, chewing tobacco, and e-cigarettes. If you need help quitting, ask your health care provider.  Limit alcohol intake to no more  than 2 drinks per day. One drink equals 12 ounces of beer, 5 ounces of wine, or 1 ounces of hard liquor.  Do not use street drugs.  Do not share needles.  Ask your health care provider for help if you need support or information about quitting drugs.  Tell your health care provider if you often feel depressed.  Tell your health care provider if you have ever been abused or do not feel safe at home. This information is not intended to replace advice given to you by your health care provider. Make sure you discuss any questions you have with your health care provider. Document Released: 09/28/2007 Document Revised: 11/29/2015 Document Reviewed: 01/03/2015 Elsevier Interactive Patient Education  2019 [employer].

## 2018-07-27 NOTE — Progress Notes (Addendum)
[facility]  Roy Smith DOB: 48 y.o. MRN: [id number]  Subjective:  Here for Biometric Screen/brief exam  Patient  denies any fever, body aches,chills, rash, chest pain, shortness of breath, nausea, vomiting, or diarrhea.  Roy Smith has no complaints at today's visit.  Non-smoker   Patient Active Problem List   Diagnosis Date Noted  . Lumbar foraminal stenosis 06/01/2012    Class: Diagnosis of  . HNP (herniated nucleus pulposus), lumbar 06/01/2012    Current Outpatient Medications:  .  tamsulosin (FLOMAX) 0.4 MG CAPS capsule, TAKE 1 TABLET BY MOUTH EVERY DAY AFTER A MEAL, Disp: , Rfl: - not taking now- denies any symptoms.   Objective: Blood pressure 120/86, pulse 86, temperature 98.5 F (36.9 C), temperature source Oral, resp. rate 14, height 5\' 8"  (1.727 m), weight 213 lb (96.6 kg), SpO2 97 %. NAD HEENT: Within normal limits Neck: Normal Heart: Regular rate and rhythm Lungs: Clear  Assessment: Biometric screen   Plan:  Fasting glucose and lipids. Gave and reviewed After Visit Summary(AVS) with patient. Patient is advised to read the after visit summary as well and let the provider know if any question, concerns or clarifications are needed.   Discussed with patient that today's visit here is a limited biometric screening visit (not a comprehensive exam or management of any chronic problems) Discussed some health issues, including healthy eating habits and exercise. Encouraged to follow-up with PCP for annual comprehensive preventive and wellness care (and if applicable, any chronic issues).Roy Smith does not have a PCP but reports Roy Smith is going to go to [facility] where his wife goes.  Questions invited and answered.

## 2018-07-28 ENCOUNTER — Encounter: Payer: Self-pay | Admitting: Adult Health

## 2018-07-28 LAB — LIPID PANEL WITH LDL/HDL RATIO
Cholesterol, Total: 237 mg/dL — ABNORMAL HIGH (ref 100–199)
HDL: 57 mg/dL (ref >39)
LDL Calculated: 157 mg/dL — ABNORMAL HIGH (ref 0–99)
LDl/HDL Ratio: 2.8 ratio (ref 0.0–3.6)
Triglycerides: 116 mg/dL (ref 0–149)
VLDL Cholesterol Cal: 23 mg/dL (ref 5–40)

## 2018-07-28 LAB — GLUCOSE, RANDOM: Glucose: 97 mg/dL (ref 65–99)

## 2018-07-28 NOTE — Progress Notes (Signed)
Released with my chart message to patient 07/28/18

## 2018-08-05 NOTE — Addendum Note (Signed)
Addended by: FLINCHUM, MICHELLE S on: 08/05/2018 10:59 AM   Modules accepted: Level of Service  

## 2019-07-13 ENCOUNTER — Other Ambulatory Visit: Payer: Self-pay

## 2019-07-13 ENCOUNTER — Ambulatory Visit: Payer: Managed Care, Other (non HMO) | Admitting: Physician Assistant

## 2019-07-13 VITALS — BP 122/82 | HR 66 | Temp 97.2°F | Resp 18 | Ht 68.0 in | Wt 200.0 lb

## 2019-07-13 DIAGNOSIS — Z008 Encounter for other general examination: Secondary | ICD-10-CM | POA: Diagnosis not present

## 2019-07-13 NOTE — Progress Notes (Signed)
Subjective:    Patient ID: Roy Smith, male    DOB: [birth year], 49 y.o.   MRN: [id number]  HPI   Routine annual exam [employer]  Active duty  [place] well- reports doing well Has not had Covid personally diagnosed He and his wife both had significant URI late Fall last year- His wife developed pneumonia-resolved with atb Both experienced before Covid testing was being done  Lifetime non-smoker Wife was smoker and D/C with URI experience  Defers Covid immunization Also defers Flu vaccine lifetime  Has some issues with mild post void incontinence on occasion - no longer uses Flomax as side effects were not acceptable. No issues with initiation of stream Double void technique reviewed   S/P lumbar lam x 2 in the past mild residual numbness left lateral calf,4th 5th left toes Denies limitations  Does Pilates with his wife multiple times a week-  Encourage brisk  Walk- 30 min  daily  Review of Systems  As noted - other systems reported negative     Objective:   Physical Exam Vitals and nursing note reviewed.  Constitutional:      Appearance: Normal appearance.     Comments: Overweight- has lost from 228 to 200 over the past year- encouraged to continue  HENT:     Head: Normocephalic and atraumatic.     Right Ear: Tympanic membrane, ear canal and external ear normal. There is no impacted cerumen.     Left Ear: Tympanic membrane, ear canal and external ear normal. There is no impacted cerumen.     Ears:     Comments: Skin debris canals bilaterally, uses occasional warm water rinse in shower; no Qtips No inflammation    Nose: Nose normal.     Mouth/Throat:     Mouth: Mucous membranes are moist.     Comments: DDS q 6 months UTD , daily care good Eyes:     Extraocular Movements: Extraocular movements intact.     Pupils: Pupils are equal, round, and reactive to light.  Neck:     Vascular: No carotid bruit.  Cardiovascular:     Rate and Rhythm: Normal rate  and regular rhythm.     Pulses: Normal pulses.     Heart sounds: Normal heart sounds. No murmur.  Pulmonary:     Effort: Pulmonary effort is normal.     Breath sounds: Normal breath sounds.  Abdominal:     General: Abdomen is flat. Bowel sounds are normal. There is no distension.     Palpations: Abdomen is soft. There is no mass.     Tenderness: There is no abdominal tenderness. There is no right CVA tenderness, left CVA tenderness, guarding or rebound.     Hernia: No hernia is present.     Comments: Lumbar surgical scars, soft , non tender  Genitourinary:    Comments: Deferred; concerns denied except as noted Musculoskeletal:        General: No tenderness. Normal range of motion.     Cervical back: Normal range of motion and neck supple. No rigidity or tenderness.     Right lower leg: No edema.     Left lower leg: No edema.     Comments: Toe walk - heel walk - on/off exam table without assistance-squat /return without difficulty Wearing flip-flops comfortably  Lymphadenopathy:     Cervical: No cervical adenopathy.  Skin:    General: Skin is warm and dry.     Capillary Refill: Capillary refill takes less  than 2 seconds.     Comments: Mild bruising extremities, doing deck repair  Neurological:     General: No focal deficit present.     Mental Status: He is alert.     Sensory: Sensory deficit present.     Comments: DTRS 2 = equal bilat UEs. LEs Mild sensory deficit  Noted as above-left  lateral calf, 4/5 toes --denies any recent increased difficulty   Psychiatric:        Mood and Affect: Mood normal.        Behavior: Behavior normal.        Thought Content: Thought content normal.       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 Very careful with body mechanics Denies change or increase surgical low back  Tetanus status to be reviewed with PCP-do not allow to lapse encouraged  Encourage 30 minutes of brisk walking each day   Continue to monitor calories and trim

## 2019-07-14 LAB — GLUCOSE, RANDOM: Glucose: 96 mg/dL (ref 65–99)

## 2019-07-14 LAB — LIPID PANEL
Chol/HDL Ratio: 3.2 ratio (ref 0.0–5.0)
Cholesterol, Total: 213 mg/dL — ABNORMAL HIGH (ref 100–199)
HDL: 66 mg/dL (ref >39)
LDL Chol Calc (NIH): 133 mg/dL — ABNORMAL HIGH (ref 0–99)
Triglycerides: 81 mg/dL (ref 0–149)
VLDL Cholesterol Cal: 14 mg/dL (ref 5–40)

## 2021-08-08 ENCOUNTER — Encounter: Payer: Self-pay | Admitting: Specialist

## 2021-08-08 ENCOUNTER — Ambulatory Visit (INDEPENDENT_AMBULATORY_CARE_PROVIDER_SITE_OTHER): Payer: Managed Care, Other (non HMO) | Admitting: Specialist

## 2021-08-08 ENCOUNTER — Ambulatory Visit: Payer: Self-pay

## 2021-08-08 VITALS — BP 153/101 | HR 62 | Ht 68.0 in | Wt 192.0 lb

## 2021-08-08 DIAGNOSIS — M5136 Other intervertebral disc degeneration, lumbar region: Secondary | ICD-10-CM

## 2021-08-08 DIAGNOSIS — M545 Low back pain, unspecified: Secondary | ICD-10-CM | POA: Diagnosis not present

## 2021-08-08 DIAGNOSIS — M5417 Radiculopathy, lumbosacral region: Secondary | ICD-10-CM

## 2021-08-08 MED ORDER — METHYLPREDNISOLONE 4 MG PO TBPK: ORAL_TABLET | ORAL | 0 refills | Status: AC

## 2021-08-08 MED ORDER — GABAPENTIN 100 MG PO CAPS: 100.0000 mg | ORAL_CAPSULE | Freq: Every day | ORAL | 3 refills | Status: DC

## 2021-08-08 NOTE — Patient Instructions (Signed)
Plan: Avoid frequent bending and stooping  ?No lifting greater than 10 lbs. ?May use ice or moist heat for pain. ?Weight loss is of benefit. ?Best medication for lumbar disc disease is arthritis medications like motrin, celebrex and naprosyn. ?Exercise is important to improve your indurance and does allow people to function better inspite of back pain. ?Gabapentin at night for pain. ?

## 2021-08-08 NOTE — Progress Notes (Signed)
? ?Office Visit Note ?  ?Patient: Roy Smith           ?Date of Birth: 09-09-1970           ?MRN: [id number] ?Visit Date: 08/08/2021 ?             ?Requested by: No referring provider defined for this encounter. ?PCP: No primary care provider on file. ? ? ?Assessment & Plan: ?Visit Diagnoses:  ?1. Low back pain, unspecified back pain laterality, unspecified chronicity, unspecified whether sciatica present   ?2. Lumbosacral radiculopathy   ?3. Degenerative lumbar disc   ? ? ?Plan: Avoid frequent bending and stooping  ?No lifting greater than 10 lbs. ?May use ice or moist heat for pain. ?Weight loss is of benefit. ?Best medication for lumbar disc disease is arthritis medications like motrin, celebrex and naprosyn. ?Exercise is important to improve your indurance and does allow people to function better inspite of back pain. ?Gabapentin at night for pain. ? ?Follow-Up Instructions: Return in about 3 weeks (around 08/29/2021).  ? ?Orders:  ?Orders Placed This Encounter  ?Procedures  ? XR Lumbar Spine 2-3 Views  ? MR Lumbar Spine W Wo Contrast  ? ?Meds ordered this encounter  ?Medications  ? gabapentin (NEURONTIN) 100 MG capsule  ?  Sig: Take 1 capsule (100 mg total) by mouth at bedtime.  ?  Dispense:  30 capsule  ?  Refill:  3  ? methylPREDNISolone (MEDROL DOSEPAK) 4 MG TBPK tablet  ?  Sig: Take as directed 6 day dose pak  ?  Dispense:  21 tablet  ?  Refill:  0  ? ? ? ? Procedures: ?No procedures performed ? ? ?Clinical Data: ?No additional findings. ? ? ?Subjective: ?Chief Complaint  ?Patient presents with  ? Lower Back - Pain  ?  Having pain in back and in left leg with numbness in the leg  ? ? ?HPI ? ?Review of Systems ? ? ?Objective: ?Vital Signs: BP (!) 153/101 (BP Location: Left Arm, Patient Position: Sitting)   Pulse 62   Ht 5\' 8"  (1.727 m)   Wt 192 lb (87.1 kg)   BMI 29.19 kg/m?  ? ?Physical Exam ? ?Ortho Exam ? ?Specialty Comments:  ?No specialty comments available. ? ?Imaging: ?No results  found. ? ? ?PMFS History: ?Patient Active Problem List  ? Diagnosis Date Noted  ? Lumbar foraminal stenosis 06/01/2012  ?  Class: Diagnosis of  ? HNP (herniated nucleus pulposus), lumbar 06/01/2012  ? ?Past Medical History:  ?Diagnosis Date  ? Bronchitis   ? hx of  ? Chronic back pain   ? hx of  ? GERD (gastroesophageal reflux disease)   ? hx of taking zantac  ?  ?Family History  ?Problem Relation Age of Onset  ? Diabetes Mother   ? Heart disease Neg Hx   ? Hypertension Neg Hx   ? Stroke Neg Hx   ? Cancer Neg Hx   ?  ?Past Surgical History:  ?Procedure Laterality Date  ? BACK SURGERY    ? DECOMPRESSIVE LUMBAR LAMINECTOMY LEVEL 1 Left 06/01/2012  ? Procedure: DECOMPRESSIVE LUMBAR LAMINECTOMY LEVEL 1;  Surgeon: 06/03/2012, MD;  Location: [facility] OR;  Service: Orthopedics;  Laterality: Left;  left lumbar five-sacral one discectomy  ? DENTAL RESTORATION/EXTRACTION WITH X-RAY    ? hx of  ? ?Social History  ? ?Occupational History  ? Not on file  ?Tobacco Use  ? Smoking status: Never  ? Smokeless tobacco: Current  ?Substance  and Sexual Activity  ? Alcohol use: Yes  ?  Alcohol/week: 4.0 standard drinks  ?  Types: 4 Cans of beer per week  ?  Comment: "social"  ? Drug use: No  ? Sexual activity: Not on file  ? ? ? ? ? ? ?

## 2021-08-30 ENCOUNTER — Ambulatory Visit
Admission: RE | Admit: 2021-08-30 | Discharge: 2021-08-30 | Disposition: A | Discharge status: Home / Self Care | Payer: Managed Care, Other (non HMO) | Source: Ambulatory Visit | Attending: Specialist | Admitting: Specialist

## 2021-08-30 DIAGNOSIS — M5417 Radiculopathy, lumbosacral region: Secondary | ICD-10-CM

## 2021-08-30 DIAGNOSIS — M5136 Other intervertebral disc degeneration, lumbar region: Secondary | ICD-10-CM

## 2021-08-30 DIAGNOSIS — M545 Low back pain, unspecified: Secondary | ICD-10-CM

## 2021-08-30 MED ORDER — GADOBENATE DIMEGLUMINE 529 MG/ML IV SOLN
18.0000 mL | Freq: Once | INTRAVENOUS | Status: AC | PRN
  Administered 2021-08-30: 18 mL via INTRAVENOUS

## 2021-08-31 ENCOUNTER — Ambulatory Visit: Payer: No Typology Code available for payment source | Admitting: Specialist

## 2021-09-05 ENCOUNTER — Ambulatory Visit: Payer: Managed Care, Other (non HMO) | Admitting: Specialist

## 2021-09-05 ENCOUNTER — Encounter: Payer: Self-pay | Admitting: Specialist

## 2021-09-05 VITALS — BP 166/103 | HR 75 | Ht 68.0 in | Wt 192.0 lb

## 2021-09-05 DIAGNOSIS — M47816 Spondylosis without myelopathy or radiculopathy, lumbar region: Secondary | ICD-10-CM | POA: Diagnosis not present

## 2021-09-05 DIAGNOSIS — M545 Low back pain, unspecified: Secondary | ICD-10-CM

## 2021-09-05 DIAGNOSIS — M5417 Radiculopathy, lumbosacral region: Secondary | ICD-10-CM

## 2021-09-05 DIAGNOSIS — M5136 Other intervertebral disc degeneration, lumbar region: Secondary | ICD-10-CM | POA: Diagnosis not present

## 2021-09-05 MED ORDER — DICLOFENAC POTASSIUM 50 MG PO TABS: 50.0000 mg | ORAL_TABLET | Freq: Two times a day (BID) | ORAL | 2 refills | Status: DC

## 2021-09-05 NOTE — Patient Instructions (Signed)
Avoid bending, stooping and avoid lifting weights greater than 10 lbs. Avoid prolong standing and walking. Avoid frequent bending and stooping  No lifting greater than 10 lbs. May use ice or moist heat for pain. Weight loss is of benefit. Handicap license is approved. Dr. Margaretann Loveless secretary/Assistant will call to arrange for epidural steroid injection

## 2021-09-05 NOTE — Progress Notes (Signed)
Office Visit Note   Patient: Roy Smith           Date of Birth: 1970-12-19           MRN: [id number] Visit Date: 09/05/2021              Requested by: No referring provider defined for this encounter. PCP: No primary care provider on file.   Assessment & Plan: Visit Diagnoses:  1. Degenerative lumbar disc   2. Lumbosacral radiculopathy   3. Low back pain, unspecified back pain laterality, unspecified chronicity, unspecified whether sciatica present   4. Spondylosis without myelopathy or radiculopathy, lumbar region     Plan: Avoid bending, stooping and avoid lifting weights greater than 10 lbs. Avoid prolong standing and walking. Avoid frequent bending and stooping  No lifting greater than 10 lbs. May use ice or moist heat for pain. Weight loss is of benefit. Handicap license is approved. Dr. Margaretann Lovelesshasnis's secretary/Assistant will call to arrange for epidural steroid injection    Follow-Up Instructions: No follow-ups on file.   Orders:  Orders Placed This Encounter  Procedures   Ambulatory referral to Physical Medicine Rehab   No orders of the defined types were placed in this encounter.     Procedures: No procedures performed   Clinical Data: Findings:  Narrative & Impression CLINICAL DATA:  Lumbar radiculopathy with symptoms over 6 weeks. Low back pain. Prior surgery. The   EXAM: MRI LUMBAR SPINE WITHOUT AND WITH CONTRAST   TECHNIQUE: Multiplanar and multiecho pulse sequences of the lumbar spine were obtained without and with intravenous contrast.   CONTRAST:  18mL MULTIHANCE GADOBENATE DIMEGLUMINE 529 MG/ML IV SOLN   COMPARISON:  Report from 04/16/2012   FINDINGS: Segmentation:  5 lumbar type vertebrae   Alignment:  Negative for listhesis.   Vertebrae:  No fracture, evidence of discitis, or bone lesion.   Conus medullaris and cauda equina: Conus extends to the T12 level. Conus and cauda equina appear normal.   Paraspinal and other soft  tissues: Periarticular edema and enhancement around the right L3-4 and left L4-5 facet.   Disc levels:   L3-L4: Degenerative facet spurring asymmetric to the right. No neural impingement   L4-L5: Degenerative facet spurring asymmetrically bulky on the left. Mild narrowing of the disc without herniation or impingement   L5-S1:Moderate disc space narrowing with disc bulging and endplate ridging eccentric to the left. Asymmetric left facet spurring. Patent spinal canal and foramina. Decreased T1 signal at the left subarticular recess, level not covered on postcontrast imaging (patient had to get up due to nausea after contrast injection, altering localization) with this tissue enhancing on sagittal images and consistent with typical postoperative fibrosis. There is a nonenhancing left paracentral protrusion or ridge but not compressing the nerve root based on axial T2 weighted imaging.   IMPRESSION: 1. Focal L5-S1 disc degeneration with left paracentral protrusion or ridge encroaching on the subarticular recess but no nerve root compression given prior laminotomy. 2. Facet arthritis with active inflammation on the right at L3-4 and left at L4-5. 3. Incomplete postcontrast coverage. Postcontrast imaging with complicated by nausea after injection.     Electronically Signed   By: Tiburcio PeaJonathan  Watts M.D.   On: 08/30/2021 19:04     Subjective: Chief Complaint  Patient presents with   Lower Back - Follow-up    MRI Review    51 year old male with past history of back and left leg pain post left L4-5 microdiscectomy x 2.  Last in 2014 and previous 2004.   Review of Systems  Constitutional: Negative.   HENT: Negative.    Eyes: Negative.   Respiratory: Negative.    Cardiovascular: Negative.   Gastrointestinal: Negative.   Endocrine: Negative.   Genitourinary: Negative.   Musculoskeletal: Negative.   Skin: Negative.   Allergic/Immunologic: Negative.   Neurological: Negative.    Hematological: Negative.   Psychiatric/Behavioral: Negative.      Objective: Vital Signs: BP (!) 166/103 (BP Location: Left Arm, Patient Position: Sitting)   Pulse 75   Ht 5\' 8"  (1.727 m)   Wt 192 lb (87.1 kg)   BMI 29.19 kg/m   Physical Exam Constitutional:      Appearance: He is well-developed.  HENT:     Head: Normocephalic and atraumatic.  Eyes:     Pupils: Pupils are equal, round, and reactive to light.  Pulmonary:     Effort: Pulmonary effort is normal.     Breath sounds: Normal breath sounds.  Abdominal:     General: Bowel sounds are normal.     Palpations: Abdomen is soft.  Musculoskeletal:     Cervical back: Normal range of motion and neck supple.     Lumbar back: Negative right straight leg raise test and negative left straight leg raise test.  Skin:    General: Skin is warm and dry.  Neurological:     Mental Status: He is alert and oriented to person, place, and time.  Psychiatric:        Behavior: Behavior normal.        Thought Content: Thought content normal.        Judgment: Judgment normal.   Back Exam   Tenderness  The patient is experiencing tenderness in the lumbar.  Range of Motion  Flexion:  abnormal  Lateral bend right:  abnormal  Lateral bend left:  abnormal  Rotation right:  abnormal  Rotation left:  abnormal   Muscle Strength  Right Quadriceps:  5/5  Left Quadriceps:  5/5  Right Hamstrings:  5/5  Left Hamstrings:  5/5   Tests  Straight leg raise right: negative Straight leg raise left: negative  Other  Toe walk: abnormal Heel walk: abnormal Sensation: normal    Specialty Comments:  No specialty comments available.  Imaging: No results found.   PMFS History: Patient Active Problem List   Diagnosis Date Noted   Lumbar foraminal stenosis 06/01/2012    Class: Diagnosis of   HNP (herniated nucleus pulposus), lumbar 06/01/2012   Past Medical History:  Diagnosis Date   Bronchitis    hx of   Chronic back pain     hx of   GERD (gastroesophageal reflux disease)    hx of taking zantac    Family History  Problem Relation Age of Onset   Diabetes Mother    Heart disease Neg Hx    Hypertension Neg Hx    Stroke Neg Hx    Cancer Neg Hx     Past Surgical History:  Procedure Laterality Date   BACK SURGERY     DECOMPRESSIVE LUMBAR LAMINECTOMY LEVEL 1 Left 06/01/2012   Procedure: DECOMPRESSIVE LUMBAR LAMINECTOMY LEVEL 1;  Surgeon: 06/03/2012, MD;  Location: [facility] OR;  Service: Orthopedics;  Laterality: Left;  left lumbar five-sacral one discectomy   DENTAL RESTORATION/EXTRACTION WITH X-RAY     hx of   Social History   Occupational History   Not on file  Tobacco Use   Smoking status: Never   Smokeless  tobacco: Current  Substance and Sexual Activity   Alcohol use: Yes    Alcohol/week: 4.0 standard drinks    Types: 4 Cans of beer per week    Comment: "social"   Drug use: No   Sexual activity: Not on file

## 2021-09-07 ENCOUNTER — Telehealth: Payer: Self-pay | Admitting: Specialist

## 2021-09-07 NOTE — Telephone Encounter (Signed)
Pt called requesting for [place] to refax referral to Dr. Devona Konig. They stated they did not receive referral. Pt phone number is [phone].

## 2021-09-07 NOTE — Telephone Encounter (Signed)
Referral has been faxed to Dr. Yves Dill' office. Advised the patient.

## 2021-10-10 ENCOUNTER — Ambulatory Visit: Payer: Managed Care, Other (non HMO) | Admitting: Specialist

## 2021-11-19 ENCOUNTER — Ambulatory Visit: Payer: Managed Care, Other (non HMO) | Admitting: Specialist

## 2021-12-02 ENCOUNTER — Other Ambulatory Visit: Payer: Self-pay | Admitting: Specialist
